# Patient Record
Sex: Male | Born: 1980 | Race: White | Hispanic: No | Marital: Single | Smoking: Current every day smoker
Health system: Southern US, Community
[De-identification: ages and names within clinical notes are randomized; demographics above are authoritative.]

---

## 2011-06-14 ENCOUNTER — Emergency Department (HOSPITAL_COMMUNITY): Payer: Self-pay

## 2011-06-14 ENCOUNTER — Emergency Department (HOSPITAL_COMMUNITY)
Admission: EM | Admit: 2011-06-14 | Discharge: 2011-06-14 | Disposition: A | Discharge status: Home / Self Care | Payer: Self-pay | Attending: Emergency Medicine | Admitting: Emergency Medicine

## 2011-06-14 DIAGNOSIS — S298XXA Other specified injuries of thorax, initial encounter: Secondary | ICD-10-CM | POA: Insufficient documentation

## 2011-06-14 DIAGNOSIS — R071 Chest pain on breathing: Secondary | ICD-10-CM | POA: Insufficient documentation

## 2014-09-30 ENCOUNTER — Encounter (HOSPITAL_COMMUNITY): Payer: Self-pay | Admitting: Emergency Medicine

## 2014-09-30 ENCOUNTER — Emergency Department (HOSPITAL_COMMUNITY)
Admission: EM | Admit: 2014-09-30 | Discharge: 2014-10-01 | Disposition: A | Discharge status: Home / Self Care | Payer: Self-pay | Attending: Emergency Medicine | Admitting: Emergency Medicine

## 2014-09-30 DIAGNOSIS — W260XXA Contact with knife, initial encounter: Secondary | ICD-10-CM | POA: Insufficient documentation

## 2014-09-30 DIAGNOSIS — S61011A Laceration without foreign body of right thumb without damage to nail, initial encounter: Secondary | ICD-10-CM | POA: Insufficient documentation

## 2014-09-30 DIAGNOSIS — IMO0002 Reserved for concepts with insufficient information to code with codable children: Secondary | ICD-10-CM

## 2014-09-30 DIAGNOSIS — Y998 Other external cause status: Secondary | ICD-10-CM | POA: Insufficient documentation

## 2014-09-30 DIAGNOSIS — Y93G1 Activity, food preparation and clean up: Secondary | ICD-10-CM | POA: Insufficient documentation

## 2014-09-30 DIAGNOSIS — Y9289 Other specified places as the place of occurrence of the external cause: Secondary | ICD-10-CM | POA: Insufficient documentation

## 2014-09-30 DIAGNOSIS — Z720 Tobacco use: Secondary | ICD-10-CM | POA: Insufficient documentation

## 2014-09-30 NOTE — ED Notes (Signed)
patient states he was washing dishes when he cut his right thumb on a knife.

## 2014-10-01 MED ORDER — LIDOCAINE HCL 1 % IJ SOLN
INTRAMUSCULAR | Status: AC
  Administered 2014-10-01: 20 mL
  Filled 2014-10-01: qty 20

## 2014-10-01 MED ORDER — LIDOCAINE HCL (PF) 1 % IJ SOLN
5.0000 mL | Freq: Once | INTRAMUSCULAR | Status: DC
Start: 2014-10-01 — End: 2014-10-01

## 2014-10-01 MED ORDER — HYDROCODONE-ACETAMINOPHEN 5-325 MG PO TABS
1.0000 | ORAL_TABLET | Freq: Once | ORAL | Status: AC
  Administered 2014-10-01: 1 via ORAL
  Filled 2014-10-01: qty 1

## 2014-10-01 NOTE — ED Notes (Signed)
Bacitracin applied to thumb per MD order, bandage applied.

## 2014-10-01 NOTE — ED Provider Notes (Signed)
CSN: [id number]     Arrival date & time 09/30/14  2353 History   First MD Initiated Contact with Patient 10/01/14 0006     Chief Complaint  Patient presents with  . Extremity Laceration    right thumb     (Consider location/radiation/quality/duration/timing/severity/associated sxs/prior Treatment) HPI Comments: Laceration to tip of R thumb from knife in the dish water Last tetanus 3 years ago   The history is provided by the patient.    History reviewed. No pertinent past medical history. History reviewed. No pertinent past surgical history. History reviewed. No pertinent family history. History  Substance Use Topics  . Smoking status: Current Every Day Smoker -- 1.00 packs/day    Types: Cigarettes  . Smokeless tobacco: Not on file  . Alcohol Use: Yes     Comment: occ    Review of Systems  Constitutional: Negative for fever.  Skin: Positive for wound.  Neurological: Negative for numbness.  All other systems reviewed and are negative.     Allergies  Review of patient's allergies indicates no known allergies.  Home Medications   Prior to Admission medications   Not on File   BP 131/77 mmHg  Pulse 108  Temp(Src) 97.9 F (36.6 C) (Oral)  Resp 18  Ht 5\' 8"  (1.727 m)  Wt 140 lb (63.504 kg)  BMI 21.29 kg/m2  SpO2 100% Physical Exam  Constitutional: He is oriented to person, place, and time. He appears well-developed and well-nourished.  HENT:  Head: Normocephalic.  Eyes: Pupils are equal, round, and reactive to light.  Cardiovascular: Normal rate and regular rhythm.   Pulmonary/Chest: Effort normal and breath sounds normal.  Musculoskeletal: Normal range of motion.  Neurological: He is alert and oriented to person, place, and time.  Skin: No erythema.  1 cm laceration to tip of R thumb sparing nail  Nursing note and vitals reviewed.   ED Course  LACERATION REPAIR Date/Time: 10/01/2014 12:51 AM Performed by: Arman FilterSCHULZ, Lana Flaim K Authorized by: Arman FilterSCHULZ, Devora Tortorella  K Consent: Verbal consent obtained. Written consent not obtained. Risks and benefits: risks, benefits and alternatives were discussed Consent given by: patient Patient understanding: patient states understanding of the procedure being performed Patient identity confirmed: verbally with patient Time out: Immediately prior to procedure a "time out" was called to verify the correct patient, procedure, equipment, support staff and site/side marked as required. Body area: upper extremity Location details: right thumb Laceration length: 0.8 cm Foreign bodies: metal Tendon involvement: none Nerve involvement: none Vascular damage: no Anesthesia: local infiltration Local anesthetic: lidocaine 1% without epinephrine Anesthetic total: 0.5 ml Patient sedated: no Preparation: Patient was prepped and draped in the usual sterile fashion. Irrigation solution: saline Amount of cleaning: standard Debridement: none Degree of undermining: none Skin closure: 3-0 Prolene Number of sutures: 4 Technique: simple Approximation: close Approximation difficulty: simple Dressing: antibiotic ointment Patient tolerance: Patient tolerated the procedure well with no immediate complications   (including critical care time) Labs Review Labs Reviewed - No data to display  Imaging Review No results found.   EKG Interpretation None      MDM   Final diagnoses:  Laceration         Arman FilterGail K Lesly Pontarelli, NP 10/01/14 [id number]  Tomasita CrumbleAdeleke Oni, MD 10/01/14 [phone]

## 2019-10-24 ENCOUNTER — Other Ambulatory Visit: Payer: Self-pay

## 2019-10-24 DIAGNOSIS — Z20822 Contact with and (suspected) exposure to covid-19: Secondary | ICD-10-CM

## 2019-10-25 LAB — NOVEL CORONAVIRUS, NAA: SARS-CoV-2, NAA: NOT DETECTED

## 2020-12-06 ENCOUNTER — Emergency Department (HOSPITAL_COMMUNITY): Payer: Self-pay

## 2020-12-06 ENCOUNTER — Encounter (HOSPITAL_COMMUNITY): Payer: Self-pay | Admitting: Emergency Medicine

## 2020-12-06 ENCOUNTER — Emergency Department (HOSPITAL_COMMUNITY)
Admission: EM | Admit: 2020-12-06 | Discharge: 2020-12-06 | Disposition: A | Discharge status: Home / Self Care | Payer: Self-pay | Attending: Emergency Medicine | Admitting: Emergency Medicine

## 2020-12-06 ENCOUNTER — Other Ambulatory Visit: Payer: Self-pay

## 2020-12-06 DIAGNOSIS — Z20822 Contact with and (suspected) exposure to covid-19: Secondary | ICD-10-CM | POA: Insufficient documentation

## 2020-12-06 DIAGNOSIS — F17210 Nicotine dependence, cigarettes, uncomplicated: Secondary | ICD-10-CM | POA: Insufficient documentation

## 2020-12-06 DIAGNOSIS — F329 Major depressive disorder, single episode, unspecified: Secondary | ICD-10-CM | POA: Insufficient documentation

## 2020-12-06 DIAGNOSIS — T401X1A Poisoning by heroin, accidental (unintentional), initial encounter: Secondary | ICD-10-CM | POA: Insufficient documentation

## 2020-12-06 DIAGNOSIS — F1120 Opioid dependence, uncomplicated: Secondary | ICD-10-CM | POA: Insufficient documentation

## 2020-12-06 DIAGNOSIS — T424X1A Poisoning by benzodiazepines, accidental (unintentional), initial encounter: Secondary | ICD-10-CM | POA: Insufficient documentation

## 2020-12-06 DIAGNOSIS — X58XXXA Exposure to other specified factors, initial encounter: Secondary | ICD-10-CM | POA: Insufficient documentation

## 2020-12-06 LAB — PROTIME-INR
INR: 1 (ref 0.8–1.2)
Prothrombin Time: 12.7 seconds (ref 11.4–15.2)

## 2020-12-06 LAB — CBC WITH DIFFERENTIAL/PLATELET
Abs Immature Granulocytes: 0.05 10*3/uL (ref 0.00–0.07)
Basophils Absolute: 0 10*3/uL (ref 0.0–0.1)
Basophils Relative: 0 %
Eosinophils Absolute: 0 10*3/uL (ref 0.0–0.5)
Eosinophils Relative: 0 %
HCT: 51.1 % (ref 39.0–52.0)
Hemoglobin: 17 g/dL (ref 13.0–17.0)
Immature Granulocytes: 0 %
Lymphocytes Relative: 14 %
Lymphs Abs: 2 10*3/uL (ref 0.7–4.0)
MCH: 30.6 pg (ref 26.0–34.0)
MCHC: 33.3 g/dL (ref 30.0–36.0)
MCV: 91.9 fL (ref 80.0–100.0)
Monocytes Absolute: 0.8 10*3/uL (ref 0.1–1.0)
Monocytes Relative: 6 %
Neutro Abs: 11.3 10*3/uL — ABNORMAL HIGH (ref 1.7–7.7)
Neutrophils Relative %: 80 %
Platelets: 278 10*3/uL (ref 150–400)
RBC: 5.56 MIL/uL (ref 4.22–5.81)
RDW: 13.2 % (ref 11.5–15.5)
WBC: 14.2 10*3/uL — ABNORMAL HIGH (ref 4.0–10.5)
nRBC: 0 % (ref 0.0–0.2)

## 2020-12-06 LAB — BLOOD GAS, VENOUS
Acid-base deficit: 0.1 mmol/L (ref 0.0–2.0)
Bicarbonate: 23.2 mmol/L (ref 20.0–28.0)
FIO2: 21
O2 Saturation: 58.6 %
Patient temperature: 98.6
pCO2, Ven: 36.1 mmHg — ABNORMAL LOW (ref 44.0–60.0)
pH, Ven: 7.424 (ref 7.250–7.430)
pO2, Ven: 31.8 mmHg — CL (ref 32.0–45.0)

## 2020-12-06 LAB — URINALYSIS, ROUTINE W REFLEX MICROSCOPIC
Bacteria, UA: NONE SEEN
Bilirubin Urine: NEGATIVE
Glucose, UA: NEGATIVE mg/dL
Hgb urine dipstick: NEGATIVE
Ketones, ur: 80 mg/dL — AB
Leukocytes,Ua: NEGATIVE
Nitrite: NEGATIVE
Protein, ur: 30 mg/dL — AB
Specific Gravity, Urine: 1.03 (ref 1.005–1.030)
pH: 6 (ref 5.0–8.0)

## 2020-12-06 LAB — RAPID URINE DRUG SCREEN, HOSP PERFORMED
Amphetamines: NOT DETECTED
Barbiturates: NOT DETECTED
Benzodiazepines: POSITIVE — AB
Cocaine: NOT DETECTED
Opiates: NOT DETECTED
Tetrahydrocannabinol: NOT DETECTED

## 2020-12-06 LAB — COMPREHENSIVE METABOLIC PANEL
ALT: 20 U/L (ref 0–44)
AST: 22 U/L (ref 15–41)
Albumin: 4.2 g/dL (ref 3.5–5.0)
Alkaline Phosphatase: 85 U/L (ref 38–126)
Anion gap: 12 (ref 5–15)
BUN: 11 mg/dL (ref 6–20)
CO2: 21 mmol/L — ABNORMAL LOW (ref 22–32)
Calcium: 9.4 mg/dL (ref 8.9–10.3)
Chloride: 107 mmol/L (ref 98–111)
Creatinine, Ser: 0.8 mg/dL (ref 0.61–1.24)
GFR, Estimated: >60 mL/min (ref >60)
Glucose, Bld: 102 mg/dL — ABNORMAL HIGH (ref 70–99)
Potassium: 3.7 mmol/L (ref 3.5–5.1)
Sodium: 140 mmol/L (ref 135–145)
Total Bilirubin: 0.9 mg/dL (ref 0.3–1.2)
Total Protein: 7 g/dL (ref 6.5–8.1)

## 2020-12-06 LAB — RESP PANEL BY RT-PCR (FLU A&B, COVID) ARPGX2
Influenza A by PCR: NEGATIVE
Influenza B by PCR: NEGATIVE
SARS Coronavirus 2 by RT PCR: NEGATIVE

## 2020-12-06 LAB — LACTIC ACID, PLASMA: Lactic Acid, Venous: 1 mmol/L (ref 0.5–1.9)

## 2020-12-06 LAB — ACETAMINOPHEN LEVEL: Acetaminophen (Tylenol), Serum: <10 ug/mL — ABNORMAL LOW (ref 10–30)

## 2020-12-06 LAB — ETHANOL: Alcohol, Ethyl (B): <10 mg/dL (ref <10)

## 2020-12-06 LAB — CK: Total CK: 101 U/L (ref 49–397)

## 2020-12-06 LAB — LIPASE, BLOOD: Lipase: 31 U/L (ref 11–51)

## 2020-12-06 MED ORDER — SODIUM CHLORIDE 0.9 % IV BOLUS
1000.0000 mL | Freq: Once | INTRAVENOUS | Status: AC
  Administered 2020-12-06: 1000 mL via INTRAVENOUS

## 2020-12-06 NOTE — ED Triage Notes (Signed)
Pt from home via EMS- Pt family called EMS when they could not contact him. Fire and parents found pt breathing but unresponsive to verbal. Pt was aroused with strenal rub. Pt detoxing from heroin, admits to ordering "synthetic benzo online". Pt took flubromazolam 0.5mg  x3-4. Pt A&O x2, unsure of the time. Pt in NAD

## 2020-12-06 NOTE — ED Notes (Addendum)
Pt throwing things in room, states "I wanted to get your attention." RN informed pt  that if he needs assistance to use the call bell that is on his lap. Pt began laughing and states "im still going to throw things." Pt given water at this time per request.

## 2020-12-06 NOTE — ED Notes (Signed)
Urine specimen collection attempted. After attempt, the pt stated that they could not urinate.

## 2020-12-06 NOTE — Discharge Instructions (Signed)
1.  Stay with a responsible friend or family member until you can restart your treatment program. 2.  A resource guide has been included in your discharge instructions to help you find facilities for drug treatment and rehabilitation. 3.  Return to the emergency department immediately if you have any thoughts of hurting yourself, kill yourself, injuring anybody else, or you develop other concerning symptoms.

## 2020-12-06 NOTE — ED Notes (Signed)
Pt finished his phone call in the room. Pt ambulated out of the ED under his own power in no distress. Made aware that there is a phone on the lobby if needed again.

## 2020-12-06 NOTE — ED Provider Notes (Signed)
[facility]-EMERGENCY DEPT Provider Note   CSN: [id number] Arrival date & time: 12/06/20  1507     History Chief Complaint  Patient presents with  . Drug Overdose    Ricardo Suarez is a 40 y.o. male.  HPI Patient reports he has history of heroin addiction. He denies other medical history. Patient reports that he did snort heroin around noon today. He reports he also took a designer benzodiazepine drug earlier in the morning call flubromazolam. Patient seemed to have difficulty identifying how much he took. Reports it comes in a liquid and he thought it was 5 mg. Reports he did not take it with an intention to overdose. EMS indicates that the dosing was 0.5 mg with 3-4 episodes of taking the medication. Patient reports that he feels really disappointed with himself. He reports his sister's boyfriend came to check on him and that was how he was found. He does not endorse being in any pain right now. He denies he has any other medical problems. Reports he felt all right yesterday. He is denying any positives on review of systems.    History reviewed. No pertinent past medical history.  There are no problems to display for this patient.   History reviewed. No pertinent surgical history.     No family history on file.  Social History   Tobacco Use  . Smoking status: Current Every Day Smoker    Packs/day: 1.00    Types: Cigarettes  Substance Use Topics  . Alcohol use: Yes    Comment: occ  . Drug use: No    Home Medications Prior to Admission medications   Not on File    Allergies    Patient has no known allergies.  Review of Systems   Review of Systems 10 systems reviewed and negative except as per HPI Physical Exam Updated Vital Signs BP 117/84 (BP Location: Left Arm)   Pulse 82   Temp 98.2 F (36.8 C) (Oral)   Resp (!) 23   Ht 5\' 8"  (1.727 m)   Wt 74.8 kg   SpO2 99%   BMI 25.09 kg/m   Physical Exam Constitutional:      Comments:  Patient is resting quietly is into the room. He awakens to light voice stimulus. No respiratory distress. He appears slightly somnolent but is appropriate with normal situational responses. No respiratory distress but occasional wet cough.  HENT:     Head: Normocephalic and atraumatic.     Mouth/Throat:     Pharynx: Oropharynx is clear.  Eyes:     Extraocular Movements: Extraocular movements intact.     Pupils: Pupils are equal, round, and reactive to light.     Comments: Pupils approximately 5 mm and symmetric and responsive. Extraocular motions intact.  Cardiovascular:     Rate and Rhythm: Normal rate and regular rhythm.  Pulmonary:     Effort: Pulmonary effort is normal.     Breath sounds: Normal breath sounds.  Abdominal:     General: There is no distension.     Palpations: Abdomen is soft.     Tenderness: There is no abdominal tenderness. There is no guarding.  Musculoskeletal:        General: No swelling or tenderness. Normal range of motion.     Cervical back: Neck supple.     Right lower leg: No edema.     Left lower leg: No edema.     Comments: Normal range of motion of the extremities. No deformities.  Minor, superficial contusions to knees. No effusions at the joints.  Skin:    General: Skin is warm and dry.  Neurological:     General: No focal deficit present.     Mental Status: He is oriented to person, place, and time.     Cranial Nerves: No cranial nerve deficit.     Motor: No weakness.     Coordination: Coordination normal.  Psychiatric:     Comments: Patient is interacting appropriately. He does appear mildly depressed. Slightly tearful discussing addiction history.     ED Results / Procedures / Treatments   Labs (all labs ordered are listed, but only abnormal results are displayed) Labs Reviewed  COMPREHENSIVE METABOLIC PANEL - Abnormal; Notable for the following components:      Result Value   CO2 21 (*)    Glucose, Bld 102 (*)    All other components  within normal limits  ACETAMINOPHEN LEVEL - Abnormal; Notable for the following components:   Acetaminophen (Tylenol), Serum <10 (*)    All other components within normal limits  CBC WITH DIFFERENTIAL/PLATELET - Abnormal; Notable for the following components:   WBC 14.2 (*)    Neutro Abs 11.3 (*)    All other components within normal limits  BLOOD GAS, VENOUS - Abnormal; Notable for the following components:   pCO2, Ven 36.1 (*)    pO2, Ven 31.8 (*)    All other components within normal limits  RESP PANEL BY RT-PCR (FLU A&B, COVID) ARPGX2  ETHANOL  LIPASE, BLOOD  LACTIC ACID, PLASMA  PROTIME-INR  CK  URINALYSIS, ROUTINE W REFLEX MICROSCOPIC  RAPID URINE DRUG SCREEN, HOSP PERFORMED    EKG EKG Interpretation  Date/Time:  Saturday December 06 2020 17:04:52 EST Ventricular Rate:  100 PR Interval:    QRS Duration: 81 QT Interval:  330 QTC Calculation: 426 R Axis:   -45 Text Interpretation: Sinus tachycardia normal, no old comparison Confirmed by Arby Barrette [phone]) on 12/06/2020 5:24:37 PM   Radiology CT Head Wo Contrast  Result Date: 12/06/2020 CLINICAL DATA:  Detox from heroin. Found with artery CT unresponsive. EXAM: CT HEAD WITHOUT CONTRAST TECHNIQUE: Contiguous axial images were obtained from the base of the skull through the vertex without intravenous contrast. COMPARISON:  None. FINDINGS: Brain: The ventricles are normal in size and configuration. No extra-axial fluid collections are identified. The gray-white differentiation is maintained. No CT findings for acute hemispheric infarction or intracranial hemorrhage. No mass lesions. The brainstem and cerebellum are normal. Vascular: No hyperdense vessels or obvious aneurysm. Skull: No acute skull fracture.  No bone lesion. Sinuses/Orbits: Moderate mucoperiosteal thickening involving the left maxillary sinus. Scattered mucosal thickening in the left ethmoid air cells. The other paranasal sinuses are clear and the mastoid air  cells and middle ear cavities are clear. The globes are intact. Other: No scalp lesions, laceration or hematoma. IMPRESSION: 1. No acute intracranial findings or skull fracture. 2. Left maxillary and ethmoid sinus disease. Electronically Signed   By: Rudie Meyer M.D.   On: 12/06/2020 16:40   DG Chest Port 1 View  Result Date: 12/06/2020 CLINICAL DATA:  Pt from home via EMS. Pt's family called EMS when they could not contact him. Fire and parents found pt breathing but unresponsive to verbal. Pt was aroused with strenal rub. Pt detoxing from heroin, admits to ordering "synthetic benzo online". EXAM: PORTABLE CHEST 1 VIEW COMPARISON:  06/14/2011 FINDINGS: The heart size and mediastinal contours are within normal limits. Both lungs are clear. No pleural  effusion or pneumothorax. Skeletal structures are grossly intact. IMPRESSION: No active disease. Electronically Signed   By: Amie Portland M.D.   On: 12/06/2020 16:24    Procedures Procedures   Medications Ordered in ED Medications  sodium chloride 0.9 % bolus 1,000 mL (0 mLs Intravenous Stopped 12/06/20 1900)    ED Course  I have reviewed the triage vital signs and the nursing notes.  Pertinent labs & imaging results that were available during my care of the patient were reviewed by me and considered in my medical decision making (see chart for details).    MDM Rules/Calculators/A&P                         Upon several rechecks, patient remains alert and interacts appropriately.  No signs of any persistent somnolence or confusion.  Vital signs have remained stable.  Patient does not endorse any suicidal ideation.  He describes using drugs of abuse but unintentional overdose.  Patient was somnolent on EMS arrival but did not require Narcan or respiratory support.  Patient is medically cleared.  Diagnostic studies within normal limits.  Patient has been seen by behavioral health with recommendation for outpatient follow-up for drug abuse and  dependency.  Patient's mother has been present.  She advises they will be able to assist the patient and also encourage for attending another drug rehabilitation program.  Final Clinical Impression(s) / ED Diagnoses Final diagnoses:  Accidental overdose of heroin, initial encounter (HCC)  Benzodiazepine overdose, accidental or unintentional, initial encounter    Rx / DC Orders ED Discharge Orders    None       Arby Barrette, MD 12/06/20 2033

## 2020-12-06 NOTE — ED Notes (Signed)
Pt states that he took benzos to sleep and does not want to harm himself. Pt denies SI/HI

## 2020-12-06 NOTE — ED Notes (Signed)
TTS at bedside. 

## 2020-12-06 NOTE — ED Notes (Signed)
Pt removed IV from right arm and proceeded to laugh about it. Pt stated "if you put another IV in me I'll pull it out." Site clean, dry, intact and not bleeding.

## 2020-12-06 NOTE — BH Assessment (Addendum)
Comprehensive Clinical Assessment (CCA) Note  12/06/2020 Ricardo Suarez [id number]   Patient is a 40 year old male presenting voluntarily to [facility] ED for assessment via EMS after an unintentional overdose on a synthetic benzodiazepine, flubromazolam. Patient is cooperative during assessment. Patient reports he has been addicted to heroin for 10 years and has been to multiple treatment programs. He reports using about 3 grams per day. He reports he ordered this synthetic medication online to assist with withdrawals. He states his sister's boyfriend found him unresponsive. Patient adamantly denies this was a suicide attempt. He denies SI/HI/AVH. 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 Patient denies any access to weapons. Patient gives verbal consent for TTS to contact his mother, Curry Seefeldt, for collateral information at [phone].  Per collateral: Patient is addicted to heroin and most recently went to rehab in November 2021. He recently relapsed. Mother states she believes he is using the synthetic benzodiazepine to assist with withdrawal symptoms. She does not believe patient took overdose intentionally or that he would do anything to harm himself or anyone else. She would like to see patient go back into rehab.  Per Melbourne Abts, PA patient does not meet in patient care criteria and is psych cleared. Substance use resources provided.  Chief Complaint:  Chief Complaint  Patient presents with  . 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, severe   CCA Biopsychosocial Intake/Chief Complaint:  NA  Current Symptoms/Problems: NA   Patient Reported Schizophrenia/Schizoaffective Diagnosis in Past: No   Strengths: NA  Preferences: NA  Abilities: NA   Type of Services Patient Feels are Needed:  NA   Initial Clinical Notes/Concerns: NA   Mental Health Symptoms Depression:  None   Duration of Depressive symptoms: No data recorded  Mania:  None   Anxiety:   None   Psychosis:  None   Duration of Psychotic symptoms: No data recorded  Trauma:  None   Obsessions:  None   Compulsions:  None   Inattention:  None   Hyperactivity/Impulsivity:  N/A   Oppositional/Defiant Behaviors:  None   Emotional Irregularity:  None   Other Mood/Personality Symptoms:  No data recorded   Mental Status Exam Appearance and self-care  Stature:  Average   Weight:  Average weight   Clothing:  Disheveled   Grooming:  Normal   Cosmetic use:  None   Posture/gait:  Normal   Motor activity:  Not Remarkable   Sensorium  Attention:  Normal   Concentration:  Normal   Orientation:  X5   Recall/memory:  Defective in Recent   Affect and Mood  Affect:  Congruent; Inappropriate   Mood:  Euthymic   Relating  Eye contact:  Normal   Facial expression:  Responsive   Attitude toward examiner:  Cooperative; Sarcastic   Thought and Language  Speech flow: Clear and Coherent   Thought content:  Appropriate to Mood and Circumstances   Preoccupation:  None   Hallucinations:  None   Organization:  No data recorded  [employer] of Knowledge:  Fair   Intelligence:  Average   Abstraction:  Normal   Judgement:  Poor   Reality Testing:  Distorted   Insight:  Lacking   Decision Making:  Only simple   Social Functioning  Social Maturity:  Responsible  Social Judgement:  Normal   Stress  Stressors:  Illness   Coping Ability:  Deficient supports   Skill Deficits:  Decision making; Self-care   Supports:  Family     Religion: Religion/Spirituality Are You A Religious Person?: No  Leisure/Recreation: Leisure / Recreation Do You Have Hobbies?: No  Exercise/Diet: Exercise/Diet Do You Exercise?: No Have You Gained or Lost A Significant Amount  of Weight in the Past Six Months?: No Do You Follow a Special Diet?: No Do You Have Any Trouble Sleeping?: No   CCA Employment/Education Employment/Work Situation: Employment / Work Situation Employment situation: Employed 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, 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 How long has patient been employed?: [employer] Patient's job has been impacted by current illness: No What is the longest time patient has a held a job?: [employer] Where was the patient employed at that time?: [employer] Has patient ever been in the [employer]?: No  Education: Education Is Patient Currently Attending School?: No Last Grade Completed: 12 Name of High School: [employer] Did Garment/textile technologist From [employer]?: Yes Did Theme park manager?: No Did Designer, television/film set?: No Did You Have An Individualized Education Program (IIEP): No Did You Have Any Difficulty At [employer]?: No Patient's Education Has Been Impacted by Current Illness: No   CCA Family/Childhood History Family and Relationship History: Family history Marital status: Single Are you sexually active?: Yes What is your sexual orientation?: heterosexual Has your sexual activity been affected by drugs, alcohol, medication, or emotional stress?: NA Does patient have children?: No  Childhood History:  Childhood History By whom was/is the patient raised?: Both parents Additional childhood history information: UTA Description of patient's relationship with caregiver when they were a child: supportive Patient's description of current relationship with people who raised him/her: supportive How were you disciplined when you got in trouble as a child/adolescent?: verbally Does patient have siblings?: Yes Number of Siblings: 1 Description of patient's current relationship with siblings: sister- supportive, found patient Did patient suffer any verbal/emotional/physical/sexual abuse as a child?: No Did patient suffer from severe childhood  neglect?: No Has patient ever been sexually abused/assaulted/raped as an adolescent or adult?: No Was the patient ever a victim of a crime or a disaster?: No Witnessed domestic violence?: No Has patient been affected by domestic violence as an adult?: No  Child/Adolescent Assessment:     CCA Substance Use Alcohol/Drug Use: Alcohol / Drug Use Pain Medications: see MAR Prescriptions: see MAR Over the Counter: see MAR History of alcohol / drug use?: Yes Substance #1 Name of Substance 1: heroin 1 - Age of First Use: 29 1 - Amount (size/oz): 3 grams total 1 - Frequency: daily (6-8 times per day) 1 - Duration: 10 years 1 - Last Use / Amount: 2/11 3 grams 1 - Method of Aquiring: UTA 1- Route of Use: snort                       ASAM's:  Six Dimensions of Multidimensional Assessment  Dimension 1:  Acute Intoxication and/or Withdrawal Potential:   Dimension 1:  Description of individual's past and current experiences of substance use and withdrawal: history of benzo, alcohol, and heroin addiction  Dimension 2:  Biomedical Conditions and Complications:   Dimension 2:  Description of patient's biomedical conditions and  complications: none reported  Dimension 3:  Emotional, Behavioral, or Cognitive Conditions and Complications:  Dimension 3:  Description of emotional, behavioral, or cognitive conditions and complications: none reported  Dimension 4:  Readiness to Change:  Dimension 4:  Description of Readiness to Change criteria: patient has little insight into addiction, seems contemplative  Dimension 5:  Relapse, Continued use, or Continued Problem Potential:  Dimension 5:  Relapse, continued use, or continued problem potential critiera description: not interested in treatment  Dimension 6:  Recovery/Living Environment:  Dimension 6:  Recovery/Iiving environment criteria description: supportive family, own resident, owns business  ASAM Severity Score: ASAM's Severity Rating  Score: 7  ASAM Recommended Level of Treatment: ASAM Recommended Level of Treatment: Level II Intensive Outpatient Treatment   Substance use Disorder (SUD) Substance Use Disorder (SUD)  Checklist Symptoms of Substance Use: Continued use despite having a persistent/recurrent physical/psychological problem caused/exacerbated by use,Continued use despite persistent or recurrent social, interpersonal problems, caused or exacerbated by use,Evidence of tolerance,Evidence of withdrawal (Comment),Large amounts of time spent to obtain, use or recover from the substance(s),Persistent desire or unsuccessful efforts to cut down or control use,Repeated use in physically hazardous situations,Recurrent use that results in a failure to fulfill major role obligations (work, school, home),Presence of craving or strong urge to use,Social, occupational, recreational activities given up or reduced due to use,Substance(s) often taken in larger amounts or over longer times than was intended  Recommendations for Services/Supports/Treatments: Recommendations for Services/Supports/Treatments Recommendations For Services/Supports/Treatments: CD-IOP Intensive Chemical Dependency Program  DSM5 Diagnoses: There are no problems to display for this patient.   Patient Centered Plan: Patient is on the following Treatment Plan(s):   Referrals to Alternative Service(s): Referred to Alternative Service(s):   Place:   Date:   Time:    Referred to Alternative Service(s):   Place:   Date:   Time:    Referred to Alternative Service(s):   Place:   Date:   Time:    Referred to Alternative Service(s):   Place:   Date:   Time:     Celedonio Miyamoto, LCSW

## 2020-12-06 NOTE — ED Notes (Addendum)
Pt attempting to get out of bed, pulled off all monitoring equipment and states "im leaving." Pt encouraged to stay in bed. MD Pfeiffer notified.

## 2020-12-06 NOTE — ED Notes (Signed)
Fall Bundle: Yellow fall risk armband Yellow socks Bed alarm on Fall risk sign outside door

## 2020-12-06 NOTE — ED Notes (Signed)
Discharge papers reviewed with pt. Pt responded stating "I know the password to your computer." RN asked pt if he had in questions regarding his discharge. Pt responded "the password is homo." Pt calling family for ride home at this time.

## 2020-12-06 NOTE — ED Notes (Signed)
Patient's mother at bedside.

## 2021-04-28 ENCOUNTER — Emergency Department (HOSPITAL_COMMUNITY): Payer: Self-pay

## 2021-04-28 ENCOUNTER — Emergency Department (HOSPITAL_COMMUNITY)
Admission: EM | Admit: 2021-04-28 | Discharge: 2021-04-28 | Disposition: A | Discharge status: Home / Self Care | Payer: Self-pay | Attending: Emergency Medicine | Admitting: Emergency Medicine

## 2021-04-28 DIAGNOSIS — Z23 Encounter for immunization: Secondary | ICD-10-CM | POA: Insufficient documentation

## 2021-04-28 DIAGNOSIS — F17210 Nicotine dependence, cigarettes, uncomplicated: Secondary | ICD-10-CM | POA: Insufficient documentation

## 2021-04-28 DIAGNOSIS — S93409A Sprain of unspecified ligament of unspecified ankle, initial encounter: Secondary | ICD-10-CM

## 2021-04-28 DIAGNOSIS — S93401A Sprain of unspecified ligament of right ankle, initial encounter: Secondary | ICD-10-CM | POA: Insufficient documentation

## 2021-04-28 DIAGNOSIS — Y30XXXA Falling, jumping or pushed from a high place, undetermined intent, initial encounter: Secondary | ICD-10-CM | POA: Insufficient documentation

## 2021-04-28 DIAGNOSIS — S93402A Sprain of unspecified ligament of left ankle, initial encounter: Secondary | ICD-10-CM | POA: Insufficient documentation

## 2021-04-28 DIAGNOSIS — Y9339 Activity, other involving climbing, rappelling and jumping off: Secondary | ICD-10-CM | POA: Insufficient documentation

## 2021-04-28 MED ORDER — IBUPROFEN 800 MG PO TABS
800.0000 mg | ORAL_TABLET | Freq: Once | ORAL | Status: AC
  Administered 2021-04-28: 800 mg via ORAL
  Filled 2021-04-28: qty 1

## 2021-04-28 MED ORDER — ACETAMINOPHEN 500 MG PO TABS
1000.0000 mg | ORAL_TABLET | Freq: Once | ORAL | Status: AC
  Administered 2021-04-28: 1000 mg via ORAL
  Filled 2021-04-28: qty 2

## 2021-04-28 MED ORDER — TETANUS-DIPHTH-ACELL PERTUSSIS 5-2.5-18.5 LF-MCG/0.5 IM SUSY
0.5000 mL | PREFILLED_SYRINGE | Freq: Once | INTRAMUSCULAR | Status: AC
  Administered 2021-04-28: 0.5 mL via INTRAMUSCULAR
  Filled 2021-04-28: qty 0.5

## 2021-04-28 NOTE — ED Triage Notes (Signed)
Pt jumped out of second story window and hurt his right ankle. Brought in in police custody.

## 2021-04-28 NOTE — ED Provider Notes (Signed)
[facility]-EMERGENCY DEPT Provider Note   CSN: [id number] Arrival date & time: 04/28/21  0243     History Chief Complaint  Patient presents with   Foot Injury    Ricardo Suarez is a 40 y.o. male.  Patient presents to the emergency department for evaluation of bilateral foot and ankle injury.  Patient jumped out of a second story window just prior to arrival.  Patient denies upper leg injury, back injury, upper extremity injury.  He is noted to walk into the department with police, antalgic gait noted.      No past medical history on file.  There are no problems to display for this patient.   No past surgical history on file.     No family history on file.  Social History   Tobacco Use   Smoking status: Every Day    Packs/day: 1.00    Pack years: 0.00    Types: Cigarettes  Substance Use Topics   Alcohol use: Yes    Comment: occ   Drug use: No    Home Medications Prior to Admission medications   Not on File    Allergies    Patient has no known allergies.  Review of Systems   Review of Systems  Musculoskeletal:  Positive for arthralgias. Negative for back pain.  All other systems reviewed and are negative.  Physical Exam Updated Vital Signs BP 135/73   Pulse 92   Temp 98.6 F (37 C) (Oral)   Resp 16   SpO2 99%   Physical Exam Vitals and nursing note reviewed.  Constitutional:      General: He is not in acute distress.    Appearance: Normal appearance. He is well-developed.  HENT:     Head: Normocephalic and atraumatic.     Right Ear: Hearing normal.     Left Ear: Hearing normal.     Nose: Nose normal.  Eyes:     Conjunctiva/sclera: Conjunctivae normal.     Pupils: Pupils are equal, round, and reactive to light.  Cardiovascular:     Rate and Rhythm: Regular rhythm.     Heart sounds: S1 normal and S2 normal. No murmur heard.   No friction rub. No gallop.  Pulmonary:     Effort: Pulmonary effort is normal. No  respiratory distress.     Breath sounds: Normal breath sounds.  Chest:     Chest wall: No tenderness.  Abdominal:     General: Bowel sounds are normal.     Palpations: Abdomen is soft.     Tenderness: There is no abdominal tenderness. There is no guarding or rebound. Negative signs include Murphy's sign and McBurney's sign.     Hernia: No hernia is present.  Musculoskeletal:     Cervical back: Normal range of motion and neck supple.     Lumbar back: Normal. No bony tenderness.     Right ankle: Swelling present. No deformity or ecchymosis. Tenderness present. Decreased range of motion.     Left ankle: Swelling present. No deformity or ecchymosis. Tenderness present. Decreased range of motion.     Right foot: Laceration (superficial - plantar) and tenderness present. No swelling or deformity.     Left foot: Tenderness present. No swelling or deformity.  Skin:    General: Skin is warm and dry.     Findings: No rash.  Neurological:     Mental Status: He is alert and oriented to person, place, and time.     GCS: GCS  eye subscore is 4. GCS verbal subscore is 5. GCS motor subscore is 6.     Cranial Nerves: No cranial nerve deficit.     Sensory: No sensory deficit.     Coordination: Coordination normal.  Psychiatric:        Speech: Speech normal.        Behavior: Behavior normal.        Thought Content: Thought content normal.    ED Results / Procedures / Treatments   Labs (all labs ordered are listed, but only abnormal results are displayed) Labs Reviewed - No data to display  EKG None  Radiology DG Ankle Complete Left  Result Date: 04/28/2021 CLINICAL DATA:  Bilateral foot and ankle injury after jumping out of a window. EXAM: LEFT ANKLE COMPLETE - 3+ VIEW COMPARISON:  None. FINDINGS: There is no evidence of fracture, dislocation, or joint effusion. There is no evidence of arthropathy or other focal bone abnormality. Soft tissues are unremarkable. IMPRESSION: Negative.  Electronically Signed   By: Burman Nieves M.D.   On: 04/28/2021 03:20   DG Ankle Complete Right  Result Date: 04/28/2021 CLINICAL DATA:  Bilateral foot and ankle injury after jumping out of a window. EXAM: RIGHT ANKLE - COMPLETE 3+ VIEW COMPARISON:  None. FINDINGS: There is no evidence of fracture, dislocation, or joint effusion. There is no evidence of arthropathy or other focal bone abnormality. Soft tissues are unremarkable. IMPRESSION: Negative. Electronically Signed   By: Burman Nieves M.D.   On: 04/28/2021 03:21   DG Foot Complete Left  Result Date: 04/28/2021 CLINICAL DATA:  Bilateral foot and ankle injury after jumping out of a window. EXAM: LEFT FOOT - COMPLETE 3+ VIEW COMPARISON:  None. FINDINGS: There is no evidence of fracture or dislocation. There is no evidence of arthropathy or other focal bone abnormality. Soft tissues are unremarkable. IMPRESSION: Negative. Electronically Signed   By: Burman Nieves M.D.   On: 04/28/2021 03:19   DG Foot Complete Right  Result Date: 04/28/2021 CLINICAL DATA:  Bilateral foot and ankle injury after jumping out of window. EXAM: RIGHT FOOT COMPLETE - 3+ VIEW COMPARISON:  None. FINDINGS: There is no evidence of fracture or dislocation. There is no evidence of arthropathy or other focal bone abnormality. Soft tissues are unremarkable. IMPRESSION: Negative. Electronically Signed   By: Burman Nieves M.D.   On: 04/28/2021 03:19    Procedures Procedures   Medications Ordered in ED Medications  Tdap (BOOSTRIX) injection 0.5 mL (has no administration in time range)    ED Course  I have reviewed the triage vital signs and the nursing notes.  Pertinent labs & imaging results that were available during my care of the patient were reviewed by me and considered in my medical decision making (see chart for details).    MDM Rules/Calculators/A&P                          Patient presents to the emergency department for evaluation of bilateral foot  and ankle injury after jumping from a window.  No deformity noted on exam.  He does have swelling, mostly in the left ankle area.  X-ray of bilateral ankles and feet were negative. Final Clinical Impression(s) / ED Diagnoses Final diagnoses:  Sprain of ankle, unspecified laterality, unspecified ligament, initial encounter    Rx / DC Orders ED Discharge Orders     None        Liticia Gasior, Canary Brim, MD 04/28/21 [phone]

## 2022-10-14 IMAGING — DX DG FOOT COMPLETE 3+V*L*
3 series · 3 of 3 positions shown · non-contrast
Comparison: None.

CLINICAL DATA: Bilateral foot and ankle injury after jumping out of
a window.

EXAM:
LEFT FOOT - COMPLETE 3+ VIEW

[foot ap]
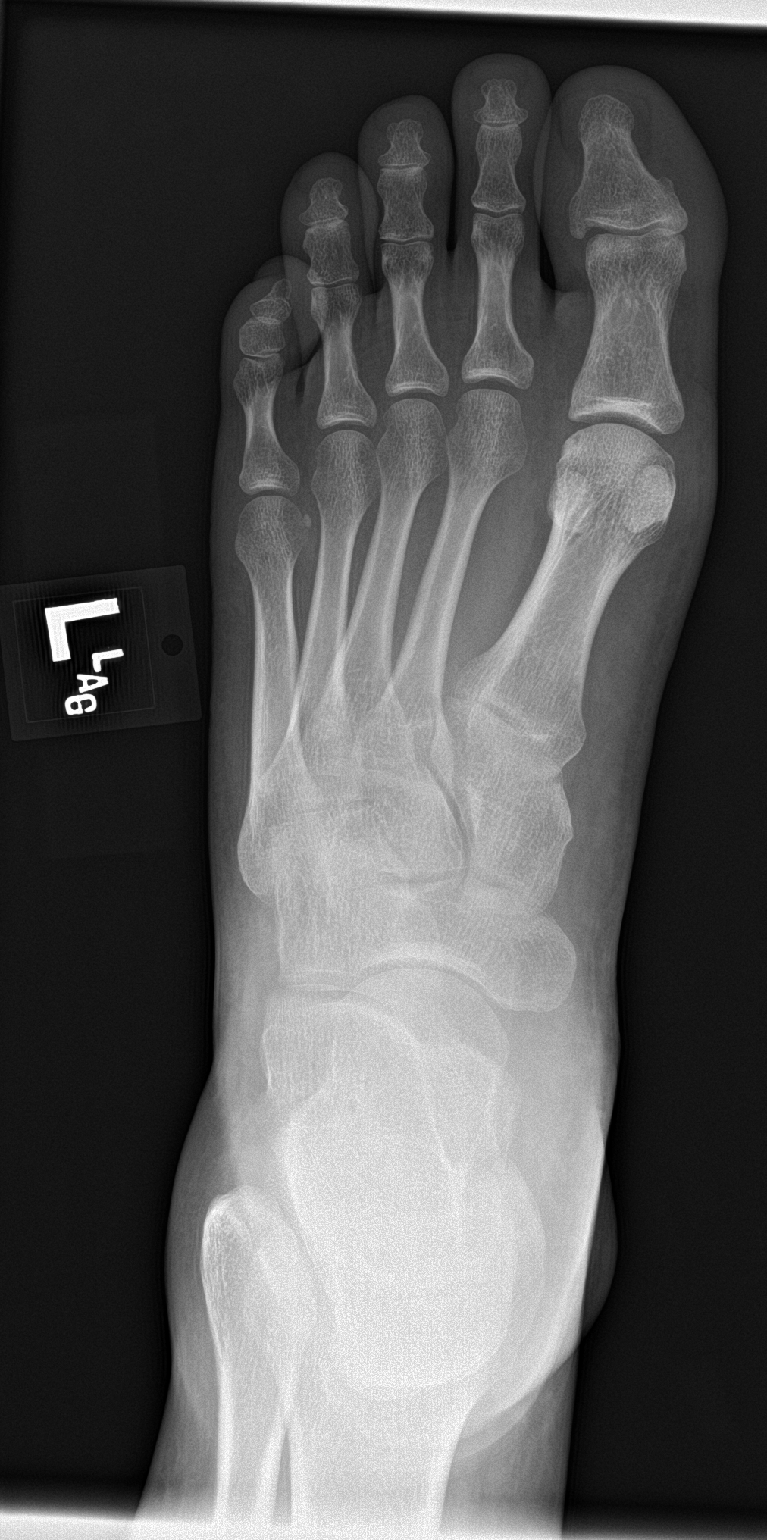

[foot obl]
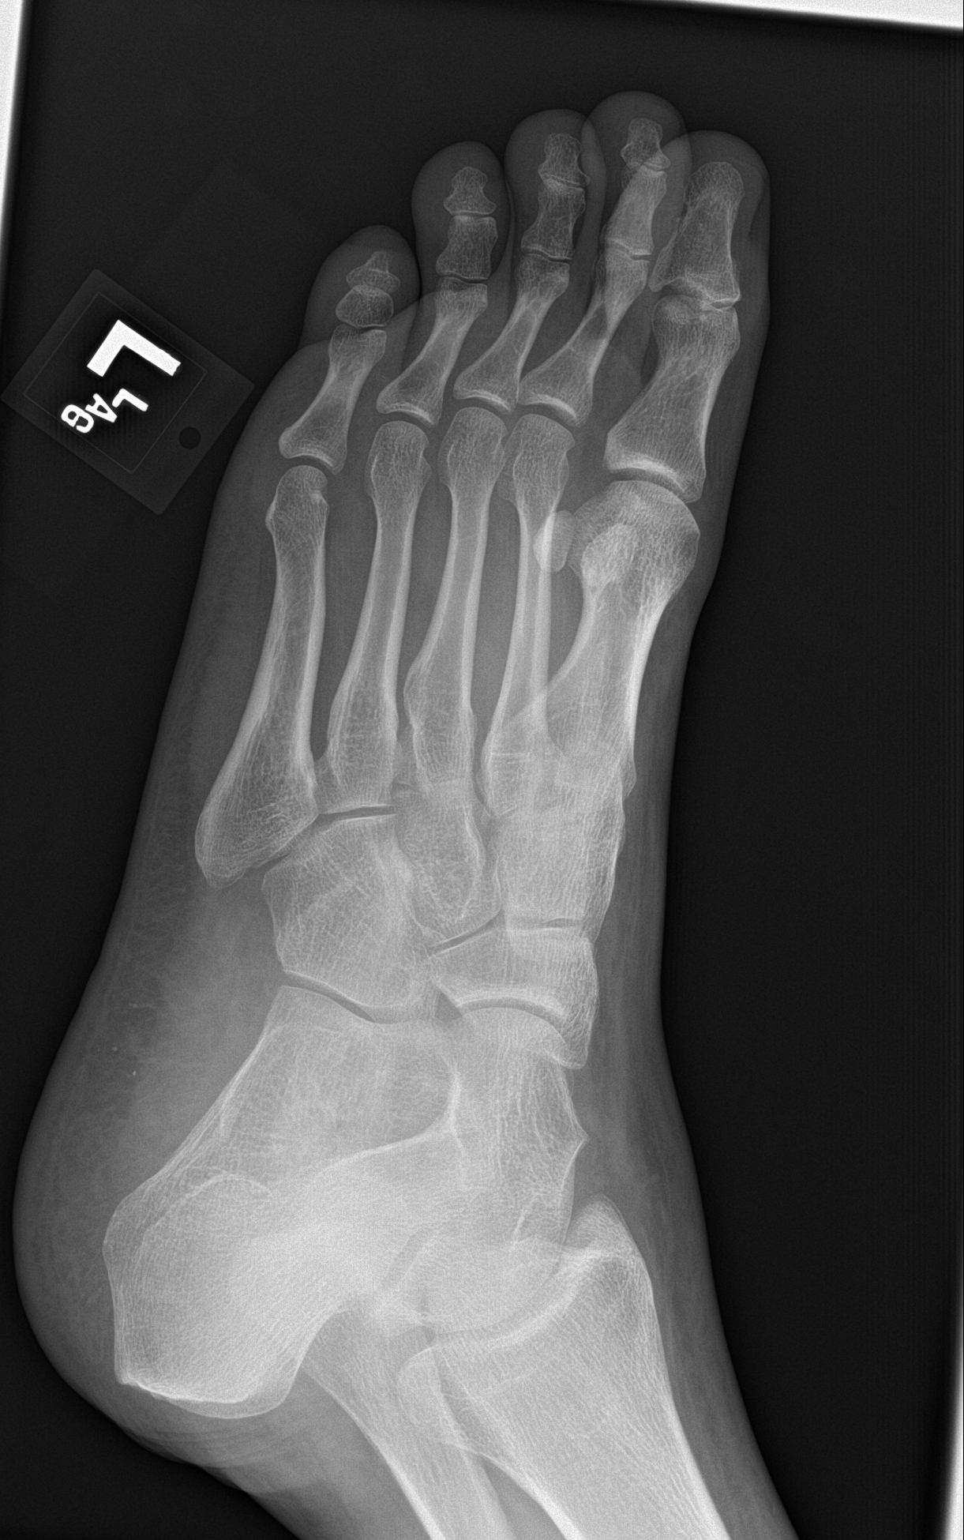

[foot lat]
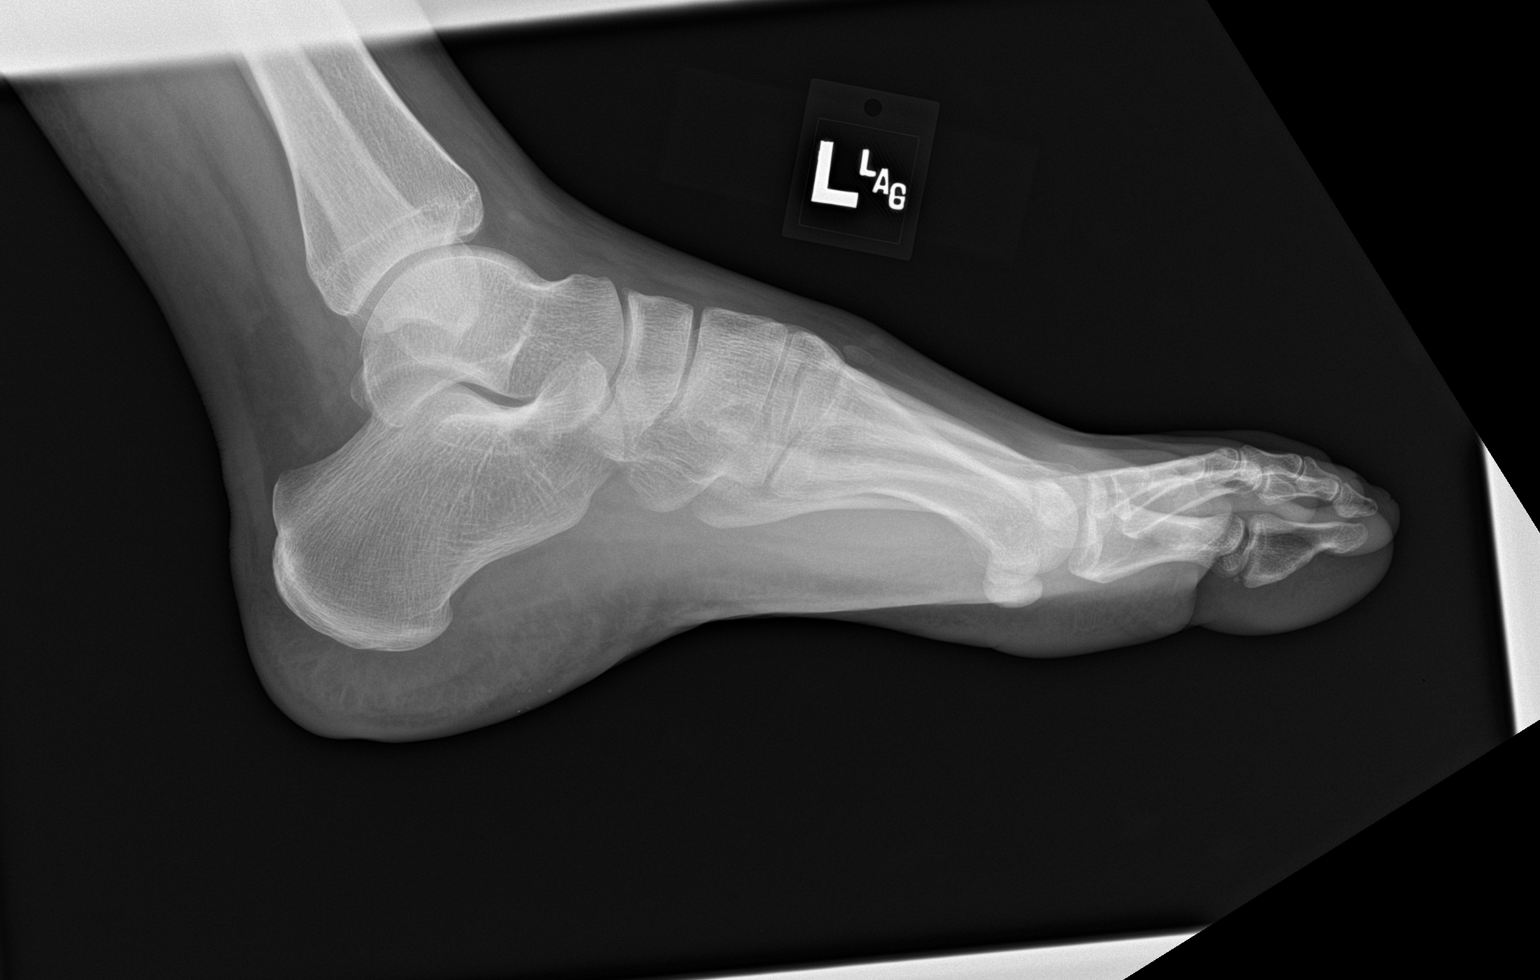

[3 of 3 positions shown; findings below may reference images not displayed]

FINDINGS: There is no evidence of fracture or dislocation. There is no
evidence of arthropathy or other focal bone abnormality. Soft
tissues are unremarkable.
IMPRESSION: Negative.
